# Patient Record
Sex: Male | Born: 1995 | Hispanic: Yes | Marital: Married | Smoking: Never smoker
Health system: Southern US, Community
[De-identification: ages and names within clinical notes are randomized; demographics above are authoritative.]

---

## 2020-11-10 ENCOUNTER — Emergency Department (HOSPITAL_COMMUNITY): Payer: Self-pay

## 2020-11-10 ENCOUNTER — Other Ambulatory Visit: Payer: Self-pay

## 2020-11-10 ENCOUNTER — Encounter (HOSPITAL_COMMUNITY): Payer: Self-pay

## 2020-11-10 ENCOUNTER — Emergency Department (HOSPITAL_COMMUNITY)
Admission: EM | Admit: 2020-11-10 | Discharge: 2020-11-10 | Disposition: A | Discharge status: Home / Self Care | Payer: Self-pay | Attending: Emergency Medicine | Admitting: Emergency Medicine

## 2020-11-10 DIAGNOSIS — K209 Esophagitis, unspecified without bleeding: Secondary | ICD-10-CM | POA: Insufficient documentation

## 2020-11-10 DIAGNOSIS — K2900 Acute gastritis without bleeding: Secondary | ICD-10-CM | POA: Insufficient documentation

## 2020-11-10 LAB — HEPATIC FUNCTION PANEL
ALT: 90 U/L — ABNORMAL HIGH (ref 0–44)
AST: 53 U/L — ABNORMAL HIGH (ref 15–41)
Albumin: 4.3 g/dL (ref 3.5–5.0)
Alkaline Phosphatase: 43 U/L (ref 38–126)
Bilirubin, Direct: 0.3 mg/dL — ABNORMAL HIGH (ref 0.0–0.2)
Indirect Bilirubin: 0.5 mg/dL (ref 0.3–0.9)
Total Bilirubin: 0.8 mg/dL (ref 0.3–1.2)
Total Protein: 8 g/dL (ref 6.5–8.1)

## 2020-11-10 LAB — BASIC METABOLIC PANEL
Anion gap: 8 (ref 5–15)
BUN: 14 mg/dL (ref 6–20)
CO2: 30 mmol/L (ref 22–32)
Calcium: 10 mg/dL (ref 8.9–10.3)
Chloride: 100 mmol/L (ref 98–111)
Creatinine, Ser: 0.93 mg/dL (ref 0.61–1.24)
GFR, Estimated: >60 mL/min (ref >60)
Glucose, Bld: 96 mg/dL (ref 70–99)
Potassium: 4.4 mmol/L (ref 3.5–5.1)
Sodium: 138 mmol/L (ref 135–145)

## 2020-11-10 LAB — CBC
HCT: 45.2 % (ref 39.0–52.0)
Hemoglobin: 15.2 g/dL (ref 13.0–17.0)
MCH: 29.9 pg (ref 26.0–34.0)
MCHC: 33.6 g/dL (ref 30.0–36.0)
MCV: 89 fL (ref 80.0–100.0)
Platelets: 272 10*3/uL (ref 150–400)
RBC: 5.08 MIL/uL (ref 4.22–5.81)
RDW: 13.2 % (ref 11.5–15.5)
WBC: 9 10*3/uL (ref 4.0–10.5)
nRBC: 0 % (ref 0.0–0.2)

## 2020-11-10 LAB — TROPONIN I (HIGH SENSITIVITY)
Troponin I (High Sensitivity): 3 ng/L (ref <18)
Troponin I (High Sensitivity): 4 ng/L (ref <18)

## 2020-11-10 LAB — LIPASE, BLOOD: Lipase: 34 U/L (ref 11–51)

## 2020-11-10 LAB — LACTIC ACID, PLASMA: Lactic Acid, Venous: 1.4 mmol/L (ref 0.5–1.9)

## 2020-11-10 MED ORDER — LIDOCAINE VISCOUS HCL 2 % MT SOLN
15.0000 mL | Freq: Once | OROMUCOSAL | Status: AC
  Administered 2020-11-10: 15 mL via ORAL
  Filled 2020-11-10: qty 15

## 2020-11-10 MED ORDER — ALUM & MAG HYDROXIDE-SIMETH 200-200-20 MG/5ML PO SUSP
30.0000 mL | Freq: Once | ORAL | Status: AC
  Administered 2020-11-10: 30 mL via ORAL
  Filled 2020-11-10: qty 30

## 2020-11-10 MED ORDER — MORPHINE SULFATE (PF) 4 MG/ML IV SOLN
4.0000 mg | Freq: Once | INTRAVENOUS | Status: AC
  Administered 2020-11-10: 4 mg via INTRAVENOUS
  Filled 2020-11-10: qty 1

## 2020-11-10 MED ORDER — PANTOPRAZOLE SODIUM 40 MG IV SOLR
40.0000 mg | Freq: Once | INTRAVENOUS | Status: AC
  Administered 2020-11-10: 40 mg via INTRAVENOUS
  Filled 2020-11-10: qty 40

## 2020-11-10 MED ORDER — LACTATED RINGERS IV BOLUS
1000.0000 mL | Freq: Once | INTRAVENOUS | Status: AC
  Administered 2020-11-10: 1000 mL via INTRAVENOUS

## 2020-11-10 MED ORDER — IOHEXOL 350 MG/ML SOLN
80.0000 mL | Freq: Once | INTRAVENOUS | Status: AC | PRN
  Administered 2020-11-10: 80 mL via INTRAVENOUS

## 2020-11-10 MED ORDER — ONDANSETRON HCL 4 MG/2ML IJ SOLN
4.0000 mg | Freq: Once | INTRAMUSCULAR | Status: AC
  Administered 2020-11-10: 4 mg via INTRAVENOUS
  Filled 2020-11-10: qty 2

## 2020-11-10 MED ORDER — LIDOCAINE VISCOUS HCL 2 % MT SOLN: 15.0000 mL | Freq: Four times a day (QID) | OROMUCOSAL | 0 refills | Status: AC | PRN

## 2020-11-10 NOTE — ED Notes (Signed)
Pt able to tolerate PO intake at this time °

## 2020-11-10 NOTE — ED Triage Notes (Signed)
Patient arrived POV. With complaints of chest and back pain, SOB. Started two days ago and is getting worse. It hurts really bad when the patient eats. Feels like the food gets stuck in the middle of his chest.

## 2020-11-10 NOTE — Discharge Instructions (Signed)
The labs today look okay.  The CAT scan was normal.  This could be a lot of acid in your esophagus and your stomach causing the symptoms.  It will be important for you to continue the acid pill but also you were given a prescription for the lidocaine that might help with the pain in the meantime.  Make sure you mix it with 15 mL of Maalox.  If you continue to have pain you will need to see a specialist.  Avoid any alcohol, spicy foods or ibuprofen.  If you start developing fever, persistent vomiting, passing out or inability to breathe you should return to the emergency room.

## 2020-11-10 NOTE — ED Notes (Signed)
Pt given sprite and crackers for PO challenge.  

## 2020-11-10 NOTE — ED Provider Notes (Signed)
[facility]-EMERGENCY DEPT Provider Note   CSN: [id number] Arrival date & time: 11/10/20  1859     History Chief Complaint  Patient presents with   Chest Pain   Shortness of Breath   Back Pain    Kevin Shaw is a 25 y.o. male.  Patient is a healthy 25 year old male presenting today with complaints of upper epigastric and lower chest pain.  Patient reports he drank approximately 24 beers on Saturday and was feeling his normal self but woke up on Sunday with epigastric abdominal pain and lower chest pain.  It was painful when he tried to eat or drink and he went to [facility] on Sunday.  He reports while he was there he had blood work done and a plain x-ray and was told it was acid in his stomach and was given an antacid pill.  He has been trying to take those pills since Sunday but reports his symptoms are worsening.  Now every time he tries to eat or swallow he gets a severe pain in his xiphoid area that shoots into his back causes him to sweat and have such severe pain that makes him catch his breath.  He will also sometimes regurgitate what he ate.  He is tolerating his saliva and liquids.  He has not had fever and denies any blood in his emesis.  Stools have been normal.  No prior history of similar symptoms.  He normally will just drink heavily on the weekends but not during the week.  He denies excessive NSAID use or prior history of stomach ulcers.  Currently the pain is an 8 out of 10 and he can feel it some in his back.  The history is provided by the patient.  Chest Pain Pain location:  Substernal area and epigastric Pain quality: aching, sharp, shooting and stabbing   Pain radiates to:  Mid back Pain severity:  Severe Onset quality:  Gradual Duration:  2 days Timing:  Constant Progression:  Worsening Chronicity:  New Context comment:  Patient initially started getting abdominal pain on Sunday after drinking 24 beers on Saturday Relieved by:   Nothing Exacerbated by: Eating. Ineffective treatments:  Antacids Associated symptoms: abdominal pain, anorexia, back pain, diaphoresis, nausea, shortness of breath and vomiting   Associated symptoms: no cough and no fever   Risk factors comment:  No known medical problems Shortness of Breath Associated symptoms: abdominal pain, chest pain, diaphoresis and vomiting   Associated symptoms: no cough and no fever   Back Pain Associated symptoms: abdominal pain and chest pain   Associated symptoms: no fever       No past medical history on file.  There are no problems to display for this patient.        No family history on file.     Home Medications Prior to Admission medications   Not on File    Allergies    Patient has no allergy information on record.  Review of Systems   Review of Systems  Constitutional:  Positive for diaphoresis. Negative for fever.  Respiratory:  Positive for shortness of breath. Negative for cough.   Cardiovascular:  Positive for chest pain.  Gastrointestinal:  Positive for abdominal pain, anorexia, nausea and vomiting.  Musculoskeletal:  Positive for back pain.  All other systems reviewed and are negative.  Physical Exam Updated Vital Signs BP (!) 144/89 (BP Location: Left Arm)   Pulse 75   Temp 98.2 F (36.8 C) (Oral)  Resp 18   Ht 5\' 7"  (1.702 m)   Wt 108.9 kg   SpO2 96%   BMI 37.59 kg/m   Physical Exam Vitals and nursing note reviewed.  Constitutional:      General: He is not in acute distress.    Appearance: He is well-developed.  HENT:     Head: Normocephalic and atraumatic.  Eyes:     Conjunctiva/sclera: Conjunctivae normal.     Pupils: Pupils are equal, round, and reactive to light.  Cardiovascular:     Rate and Rhythm: Normal rate and regular rhythm.     Pulses: Normal pulses.     Heart sounds: No murmur heard. Pulmonary:     Effort: Pulmonary effort is normal. No respiratory distress.     Breath sounds: Normal  breath sounds. No wheezing or rales.  Abdominal:     General: There is no distension.     Palpations: Abdomen is soft.     Tenderness: There is abdominal tenderness. There is guarding. There is no right CVA tenderness, left CVA tenderness or rebound.     Comments: Upper abdominal tenderness most localized in the epigastric and xiphoid region.  Mild right upper and left upper quadrant tenderness.  Musculoskeletal:        General: No tenderness. Normal range of motion.     Cervical back: Normal range of motion and neck supple.     Right lower leg: No edema.     Left lower leg: No edema.  Skin:    General: Skin is warm and dry.     Findings: No erythema or rash.  Neurological:     Mental Status: He is alert and oriented to person, place, and time. Mental status is at baseline.  Psychiatric:        Mood and Affect: Mood normal.        Behavior: Behavior normal.    ED Results / Procedures / Treatments   Labs (all labs ordered are listed, but only abnormal results are displayed) Labs Reviewed  HEPATIC FUNCTION PANEL - Abnormal; Notable for the following components:      Result Value   AST 53 (*)    ALT 90 (*)    Bilirubin, Direct 0.3 (*)    All other components within normal limits  BASIC METABOLIC PANEL  CBC  LIPASE, BLOOD  LACTIC ACID, PLASMA  TROPONIN I (HIGH SENSITIVITY)  TROPONIN I (HIGH SENSITIVITY)    EKG EKG Interpretation  Date/Time:  Thursday November 10 2020 19:34:10 EDT Ventricular Rate:  84 PR Interval:  138 QRS Duration: 92 QT Interval:  362 QTC Calculation: 427 R Axis:   6 Text Interpretation: Normal sinus rhythm Normal ECG No previous tracing Confirmed by [id number] (Gwyneth Sprout) on 11/10/2020 8:30:02 PM  Radiology CT ABDOMEN PELVIS W CONTRAST  Result Date: 11/10/2020 CLINICAL DATA:  Shortness of breath and abdominal pain. EXAM: CT ABDOMEN AND PELVIS WITH CONTRAST TECHNIQUE: Multidetector CT imaging of the abdomen and pelvis was performed using the standard  protocol following bolus administration of intravenous contrast. CONTRAST:  94mL OMNIPAQUE IOHEXOL 350 MG/ML SOLN COMPARISON:  None. FINDINGS: Lower chest: No acute abnormality. Hepatobiliary: There is diffuse fatty infiltration of the liver parenchyma. No focal liver abnormality is seen. No gallstones, gallbladder wall thickening, or biliary dilatation. Pancreas: Unremarkable. No pancreatic ductal dilatation or surrounding inflammatory changes. Spleen: Normal in size without focal abnormality. Adrenals/Urinary Tract: Adrenal glands are unremarkable. Kidneys are normal, without renal calculi, focal lesion, or hydronephrosis. Bladder is unremarkable. Stomach/Bowel:  Stomach is within normal limits. Appendix appears normal. No evidence of bowel wall thickening, distention, or inflammatory changes. Vascular/Lymphatic: No significant vascular findings are present. No enlarged abdominal or pelvic lymph nodes. Reproductive: Prostate is unremarkable. Other: No abdominal wall hernia or abnormality. No abdominopelvic ascites. Musculoskeletal: No acute or significant osseous findings. IMPRESSION: 1. Fatty liver. 2. No acute or active process within the abdomen or pelvis. Electronically Signed   By: Aram Candela M.D.   On: 11/10/2020 22:45    Procedures Procedures   Medications Ordered in ED Medications - No data to display  ED Course  I have reviewed the triage vital signs and the nursing notes.  Pertinent labs & imaging results that were available during my care of the patient were reviewed by me and considered in my medical decision making (see chart for details).    MDM Rules/Calculators/A&P                           Patient is a 25 year old male presenting with worsening epigastric/xiphoid pain difficulty swallowing which anytime he tries to eat it makes the pain severely worse and he even vomits at times.  This is all in the setting of drinking 24 cans of beer on Saturday and symptoms starting the  next day.  Patient was seen in [facility] and had plain films and labs done which he reports were normal but he was given an antacid which she has been trying to take but is not helping his symptoms.  Concern for possible pancreatitis, gastric ulcer, esophageal spasm.  Lower suspicion for stricture at this time or foreign body.  Patient has not had any blood in his emesis but cannot rule out esophageal perforation.  Patient reports anytime he tries to eat the pain is so severe it causes him to sweat and feel short of breath but he denies any cough, fever or congestion.  Shortness of breath is only present when the pain is severe.  Upper abdominal pain on exam but no lower abdominal pain to suggest appendicitis or diverticulitis.  Patient denies any urinary symptoms. Labs and imaging pending.  Patient given pain control and IV fluids.  11:03 PM Since labs are reassuring.  Only mild elevation of LFTs but normal lipase.  Troponin is negative.  CT shows fatty liver but no other acute process.  Patient is feeling better after GI cocktail and IV meds.  Suspect most likely gastritis and esophagitis.  Will p.o. challenge the patient.  11:25 PM Pt tolerated po's without difficulty.  Will d/c home.   Final Clinical Impression(s) / ED Diagnoses Final diagnoses:  Esophagitis  Acute gastritis without hemorrhage, unspecified gastritis type    Rx / DC Orders ED Discharge Orders          Ordered    lidocaine (XYLOCAINE) 2 % solution  Every 6 hours PRN        11/10/20 2324             Gwyneth Sprout, MD 11/10/20 2325

## 2020-12-25 ENCOUNTER — Other Ambulatory Visit: Payer: Self-pay

## 2020-12-25 ENCOUNTER — Encounter (HOSPITAL_COMMUNITY): Payer: Self-pay

## 2020-12-25 ENCOUNTER — Emergency Department (HOSPITAL_COMMUNITY)
Admission: EM | Admit: 2020-12-25 | Discharge: 2020-12-25 | Disposition: A | Discharge status: Home / Self Care | Payer: Self-pay | Attending: Student | Admitting: Student

## 2020-12-25 DIAGNOSIS — M109 Gout, unspecified: Secondary | ICD-10-CM

## 2020-12-25 DIAGNOSIS — M10072 Idiopathic gout, left ankle and foot: Secondary | ICD-10-CM | POA: Insufficient documentation

## 2020-12-25 MED ORDER — PREDNISONE 20 MG PO TABS
40.0000 mg | ORAL_TABLET | Freq: Once | ORAL | Status: AC
  Administered 2020-12-25: 40 mg via ORAL
  Filled 2020-12-25: qty 2

## 2020-12-25 MED ORDER — PREDNISONE 20 MG PO TABS: 40.0000 mg | ORAL_TABLET | Freq: Every day | ORAL | 0 refills | Status: AC

## 2020-12-25 NOTE — ED Provider Notes (Signed)
[facility]-EMERGENCY DEPT Provider Note   CSN: [id number] Arrival date & time: 12/25/20  6720     History Chief Complaint  Patient presents with   Foot Pain    Kevin Shaw is a 25 y.o. male with a past medical history significant for gout who presents to the ED due to left ankle pain and edema that started last night. He notes he typically gets gout in his ankles. No fever or chills.  No injury.  Patient states he drank alcohol 2 nights ago.  He has not been taking his prophylactic gout medication. No treatment prior to arrival. No associated numbness/tingling. Pain is worse with palpation.   History obtained from patient and past medical records. No interpreter used during encounter.       History reviewed. No pertinent past medical history.  There are no problems to display for this patient.   History reviewed. No pertinent surgical history.     No family history on file.  Social History   Tobacco Use   Smoking status: Never   Smokeless tobacco: Never    Home Medications Prior to Admission medications   Medication Sig Start Date End Date Taking? Authorizing Provider  predniSONE (DELTASONE) 20 MG tablet Take 2 tablets (40 mg total) by mouth daily for 4 days. 12/25/20 12/29/20 Yes Maks Cavallero, Merla Riches, PA-C  albuterol (VENTOLIN HFA) 108 (90 Base) MCG/ACT inhaler Inhale 2 puffs into the lungs every 6 (six) hours as needed for wheezing or shortness of breath.    [provider]  lidocaine (XYLOCAINE) 2 % solution Use as directed 15 mLs in the mouth or throat every 6 (six) hours as needed (throat/stomach pain). Take mixed maalox 65mL 11/10/20   Gwyneth Sprout, MD  pantoprazole (PROTONIX) 20 MG tablet Take 20 mg by mouth daily.    [provider]    Allergies    Patient has no known allergies.  Review of Systems   Review of Systems  Constitutional:  Negative for chills and fever.  Musculoskeletal:  Positive for arthralgias and  joint swelling.  Neurological:  Negative for numbness.  All other systems reviewed and are negative.  Physical Exam Updated Vital Signs BP (!) 164/95 (BP Location: Right Arm)   Pulse 91   Temp 99.1 F (37.3 C)   Resp 18   Ht 5\' 7"  (1.702 m)   Wt 108.9 kg   SpO2 93%   BMI 37.59 kg/m   Physical Exam Vitals and nursing note reviewed.  Constitutional:      General: He is not in acute distress.    Appearance: He is not ill-appearing.  HENT:     Head: Normocephalic.  Eyes:     Pupils: Pupils are equal, round, and reactive to light.  Cardiovascular:     Rate and Rhythm: Normal rate and regular rhythm.     Pulses: Normal pulses.     Heart sounds: Normal heart sounds. No murmur heard.   No friction rub. No gallop.  Pulmonary:     Effort: Pulmonary effort is normal.     Breath sounds: Normal breath sounds.  Abdominal:     General: Abdomen is flat. There is no distension.     Palpations: Abdomen is soft.     Tenderness: There is no abdominal tenderness. There is no guarding or rebound.  Musculoskeletal:        General: Normal range of motion.     Cervical back: Neck supple.     Comments: Tenderness palpation  over lateral malleolus with mild edema.  No erythema.  Pedal pulses palpable.  Slight decreased range of motion due to pain.  Skin:    General: Skin is warm and dry.  Neurological:     General: No focal deficit present.     Mental Status: He is alert.  Psychiatric:        Mood and Affect: Mood normal.        Behavior: Behavior normal.    ED Results / Procedures / Treatments   Labs (all labs ordered are listed, but only abnormal results are displayed) Labs Reviewed - No data to display  EKG None  Radiology No results found.  Procedures Procedures   Medications Ordered in ED Medications  predniSONE (DELTASONE) tablet 40 mg (has no administration in time range)    ED Course  I have reviewed the triage vital signs and the nursing notes.  Pertinent labs &  imaging results that were available during my care of the patient were reviewed by me and considered in my medical decision making (see chart for details).    MDM Rules/Calculators/A&P                           25 year old male presents to the ED due to suspected gout flare.  History of gout typically in his ankles.  No fever or chills.  No injury.  Upon arrival, patient afebrile.  Triage noted patient to be tachycardic at 108 however, during my initial evaluation, patient's heart rate in the 90s.  Patient nontoxic-appearing.  Physical exam significant for tenderness over left malleolus with mild edema.  No warmth or erythema.  Left lower extremity neurovascularly intact with soft compartments.  Given patient's history, suspect gout flare.  Low suspicion for septic joint.  No injury to suggest fracture.  Patient discharged with prednisone. Advised patient to return to the ED if symptoms do not improve over the next 48 hours, if he develops a fever, or worsening of symptoms. Strict ED precautions discussed with patient. Patient states understanding and agrees to plan. Patient discharged home in no acute distress and stable vitals  Final Clinical Impression(s) / ED Diagnoses Final diagnoses:  Acute gout of left ankle, unspecified cause    Rx / DC Orders ED Discharge Orders          Ordered    predniSONE (DELTASONE) 20 MG tablet  Daily        12/25/20 1947             Jesusita Oka 12/25/20 1948    Kommor, Wyn Forster, MD 12/25/20 [phone]

## 2020-12-25 NOTE — Discharge Instructions (Signed)
It was a pleasure taking care of you today. As discussed, I suspect your symptoms are related to a gout flare. I am sending you home with steroids. Take daily for the next 4 days. I have included information on gout and how to prevent gout. I have also included the number of [facility]. Call to schedule an appointment to establish care. Return to the ER if you develop a fever or worsening of symptoms.

## 2020-12-25 NOTE — ED Triage Notes (Signed)
Pt reports left foot pain that began yesterday. Hx gout and noncompliant with medications. Pt says he doesn't know what he takes.

## 2020-12-25 NOTE — ED Provider Notes (Deleted)
Emergency Medicine Provider Triage Evaluation Note  Joselito Fieldhouse , a 25 y.o. male  was evaluated in triage.  Pt complains of left ankle pain and edema that started last night.  Patient has a history of gout and says it feels similar.  Typically gets gout in his ankles. No fever or chills.  No injury.  Patient states he drank alcohol 2 nights ago.  He has not been taking his prophylactic gout medication.  Review of Systems  Positive: arthralgia Negative: fever  Physical Exam  BP 139/89   Pulse (!) 108   Temp 99.1 F (37.3 C)   Resp 18   Ht 5\' 7"  (1.702 m)   Wt 108.9 kg   SpO2 97%   BMI 37.59 kg/m  Gen:   Awake, no distress   Resp:  Normal effort  MSK:   Moves extremities without difficulty  Other:  Tenderness palpation over left malleolus with mild edema.  Pedal pulses palpable.  Medical Decision Making  Medically screening exam initiated at 7:19 PM.  Appropriate orders placed.  Bengie Kaucher was informed that the remainder of the evaluation will be completed by another provider, this initial triage assessment does not replace that evaluation, and the importance of remaining in the ED until their evaluation is complete.  Suspect gout flare. Low suspicion for septic joint. No injury to suggest fracture.    Denman George, Mannie Stabile 12/25/20 1921

## 2021-02-15 ENCOUNTER — Ambulatory Visit: Payer: Self-pay | Admitting: Family Medicine

## 2021-03-11 ENCOUNTER — Emergency Department (HOSPITAL_COMMUNITY)
Admission: EM | Admit: 2021-03-11 | Discharge: 2021-03-12 | Disposition: A | Discharge status: Home / Self Care | Payer: Self-pay | Attending: Emergency Medicine | Admitting: Emergency Medicine

## 2021-03-11 ENCOUNTER — Other Ambulatory Visit: Payer: Self-pay

## 2021-03-11 ENCOUNTER — Encounter (HOSPITAL_COMMUNITY): Payer: Self-pay

## 2021-03-11 DIAGNOSIS — M25461 Effusion, right knee: Secondary | ICD-10-CM | POA: Insufficient documentation

## 2021-03-11 DIAGNOSIS — L600 Ingrowing nail: Secondary | ICD-10-CM

## 2021-03-11 DIAGNOSIS — M109 Gout, unspecified: Secondary | ICD-10-CM

## 2021-03-11 DIAGNOSIS — M79675 Pain in left toe(s): Secondary | ICD-10-CM | POA: Insufficient documentation

## 2021-03-11 DIAGNOSIS — M79674 Pain in right toe(s): Secondary | ICD-10-CM | POA: Insufficient documentation

## 2021-03-11 LAB — COMPREHENSIVE METABOLIC PANEL
ALT: 33 U/L (ref 0–44)
AST: 24 U/L (ref 15–41)
Albumin: 4.3 g/dL (ref 3.5–5.0)
Alkaline Phosphatase: 55 U/L (ref 38–126)
Anion gap: 9 (ref 5–15)
BUN: 15 mg/dL (ref 6–20)
CO2: 26 mmol/L (ref 22–32)
Calcium: 9.1 mg/dL (ref 8.9–10.3)
Chloride: 102 mmol/L (ref 98–111)
Creatinine, Ser: 1.12 mg/dL (ref 0.61–1.24)
GFR, Estimated: >60 mL/min (ref >60)
Glucose, Bld: 87 mg/dL (ref 70–99)
Potassium: 3.8 mmol/L (ref 3.5–5.1)
Sodium: 137 mmol/L (ref 135–145)
Total Bilirubin: 0.7 mg/dL (ref 0.3–1.2)
Total Protein: 8.2 g/dL — ABNORMAL HIGH (ref 6.5–8.1)

## 2021-03-11 LAB — CBC WITH DIFFERENTIAL/PLATELET
Abs Immature Granulocytes: 0.06 10*3/uL (ref 0.00–0.07)
Basophils Absolute: 0 10*3/uL (ref 0.0–0.1)
Basophils Relative: 0 %
Eosinophils Absolute: 0.2 10*3/uL (ref 0.0–0.5)
Eosinophils Relative: 2 %
HCT: 44.3 % (ref 39.0–52.0)
Hemoglobin: 14.7 g/dL (ref 13.0–17.0)
Immature Granulocytes: 1 %
Lymphocytes Relative: 20 %
Lymphs Abs: 2.1 10*3/uL (ref 0.7–4.0)
MCH: 29.1 pg (ref 26.0–34.0)
MCHC: 33.2 g/dL (ref 30.0–36.0)
MCV: 87.7 fL (ref 80.0–100.0)
Monocytes Absolute: 1 10*3/uL (ref 0.1–1.0)
Monocytes Relative: 10 %
Neutro Abs: 6.8 10*3/uL (ref 1.7–7.7)
Neutrophils Relative %: 67 %
Platelets: 292 10*3/uL (ref 150–400)
RBC: 5.05 MIL/uL (ref 4.22–5.81)
RDW: 12.8 % (ref 11.5–15.5)
WBC: 10.2 10*3/uL (ref 4.0–10.5)
nRBC: 0 % (ref 0.0–0.2)

## 2021-03-11 MED ORDER — CEPHALEXIN 500 MG PO CAPS: 500.0000 mg | ORAL_CAPSULE | Freq: Four times a day (QID) | ORAL | 0 refills | Status: AC

## 2021-03-11 MED ORDER — HYDROCODONE-ACETAMINOPHEN 5-325 MG PO TABS
2.0000 | ORAL_TABLET | Freq: Once | ORAL | Status: AC
Start: 2021-03-11 — End: 2021-03-11
  Administered 2021-03-11: 2 via ORAL
  Filled 2021-03-11: qty 2

## 2021-03-11 MED ORDER — HYDROCODONE-ACETAMINOPHEN 5-325 MG PO TABS: 1.0000 | ORAL_TABLET | Freq: Four times a day (QID) | ORAL | 0 refills | Status: DC | PRN

## 2021-03-11 MED ORDER — PREDNISONE 10 MG PO TABS: 20.0000 mg | ORAL_TABLET | Freq: Two times a day (BID) | ORAL | 0 refills | Status: DC

## 2021-03-11 MED ORDER — CEPHALEXIN 500 MG PO CAPS
500.0000 mg | ORAL_CAPSULE | Freq: Once | ORAL | Status: AC
  Administered 2021-03-11: 500 mg via ORAL
  Filled 2021-03-11: qty 1

## 2021-03-11 MED ORDER — PREDNISONE 20 MG PO TABS
40.0000 mg | ORAL_TABLET | Freq: Once | ORAL | Status: AC
  Administered 2021-03-11: 40 mg via ORAL
  Filled 2021-03-11: qty 2

## 2021-03-11 NOTE — Discharge Instructions (Signed)
Begin taking prednisone and Keflex as prescribed.  Take hydrocodone as prescribed as needed for pain.  Apply warm compresses as frequently as possible for the next several days.  If not improving, follow-up with podiatry.  The contact information for the podiatry office has been provided in this discharge summary for you to call and make these arrangements.

## 2021-03-11 NOTE — ED Triage Notes (Signed)
Pt reports with right knee swelling (from his gout) and right big toe swelling. Pt states that he took his medication for gout today but it is not helping.

## 2021-03-11 NOTE — ED Provider Notes (Signed)
[facility]-EMERGENCY DEPT Provider Note   CSN: [id number] Arrival date & time: 03/11/21  1916     History Chief Complaint  Patient presents with   Joint Swelling   Toe Pain    Jazier Mcglamery is a 25 y.o. male.  Patient is a 25 year old male with history of gout.  He presents today with complaints of right knee pain and bilateral toe pain.  Patient has history of gout and this feels similar.  He denies any specific injury or trauma.  He denies any fevers or chills.  Both great toes have been swollen and painful at the nail  The history is provided by the patient.      History reviewed. No pertinent past medical history.  There are no problems to display for this patient.   History reviewed. No pertinent surgical history.     History reviewed. No pertinent family history.  Social History   Tobacco Use   Smoking status: Never   Smokeless tobacco: Never    Home Medications Prior to Admission medications   Medication Sig Start Date End Date Taking? Authorizing Provider  albuterol (VENTOLIN HFA) 108 (90 Base) MCG/ACT inhaler Inhale 2 puffs into the lungs every 6 (six) hours as needed for wheezing or shortness of breath.    [provider]  lidocaine (XYLOCAINE) 2 % solution Use as directed 15 mLs in the mouth or throat every 6 (six) hours as needed (throat/stomach pain). Take mixed maalox 18mL 11/10/20   Gwyneth Sprout, MD  pantoprazole (PROTONIX) 20 MG tablet Take 20 mg by mouth daily.    [provider]    Allergies    Patient has no known allergies.  Review of Systems   Review of Systems  All other systems reviewed and are negative.  Physical Exam Updated Vital Signs BP (!) 167/93 (BP Location: Left Arm)   Pulse 94   Temp 97.8 F (36.6 C) (Oral)   Resp 16   SpO2 96%   Physical Exam Vitals and nursing note reviewed.  Constitutional:      General: He is not in acute distress.    Appearance: Normal appearance. He is  not ill-appearing.  HENT:     Head: Normocephalic and atraumatic.  Pulmonary:     Effort: Pulmonary effort is normal.  Musculoskeletal:     Comments: The right knee has a moderate sized effusion.  He has pain with range of motion.  There is no erythema or warmth.  He does have pain with range of motion.  Skin:    General: Skin is warm and dry.  Neurological:     Mental Status: He is alert and oriented to person, place, and time.    ED Results / Procedures / Treatments   Labs (all labs ordered are listed, but only abnormal results are displayed) Labs Reviewed  COMPREHENSIVE METABOLIC PANEL - Abnormal; Notable for the following components:      Result Value   Total Protein 8.2 (*)    All other components within normal limits  CBC WITH DIFFERENTIAL/PLATELET    EKG None  Radiology No results found.  Procedures Procedures   Medications Ordered in ED Medications  predniSONE (DELTASONE) tablet 40 mg (has no administration in time range)  HYDROcodone-acetaminophen (NORCO/VICODIN) 5-325 MG per tablet 2 tablet (has no administration in time range)  cephALEXin (KEFLEX) capsule 500 mg (has no administration in time range)    ED Course  I have reviewed the triage vital signs and the nursing  notes.  Pertinent labs & imaging results that were available during my care of the patient were reviewed by me and considered in my medical decision making (see chart for details).    MDM Rules/Calculators/A&P  Patient presenting with what appears to be a recurrent flareup of gout in his right knee.  This will be treated with prednisone and hydrocodone.  He also has what appears to be the beginnings of an ingrown toenail.  Patient to be treated with Keflex, warm compresses, and follow-up as needed if not improving.  Final Clinical Impression(s) / ED Diagnoses Final diagnoses:  None    Rx / DC Orders ED Discharge Orders     None        Geoffery Lyons, MD 03/11/21 2327

## 2021-03-11 NOTE — ED Provider Notes (Addendum)
Emergency Medicine Provider Triage Evaluation Note  Kevin Shaw , a 25 y.o. male  was evaluated in triage.  Pt complains of bilateral ingrown toenails in the big toes, and right knee pain secondary to known gout. Taking indomethacin with some improvement. No fevers or chills.   Review of Systems  Positive: Bilateral great toe pain, swelling, redness, purulent drainage. Right knee pain.  Negative: Fevers, chills  Physical Exam  BP (!) 167/93 (BP Location: Left Arm)   Pulse 94   Temp 97.8 F (36.6 C) (Oral)   Resp 16   SpO2 96%  Gen:   Awake, no distress   Resp:  Normal effort  MSK:   Moves extremities without difficulty  Other:  In grown toenails with erythema and induration, bilateral great toes. Right great toe with fluctuance. TTP over the lateral joint line of the right knee without erythema, warmth to the touch  Medical Decision Making  Medically screening exam initiated at 8:29 PM.  Appropriate orders placed.  [name] was informed that the remainder of the evaluation will be completed by another provider, this initial triage assessment does not replace that evaluation, and the importance of remaining in the ED until their evaluation is complete.  Labs ordered and collected by RN prior to my evaluation.  This chart was dictated using voice recognition software, Dragon. Despite the best efforts of this provider to proofread and correct errors, errors may still occur which can change documentation meaning.    Paris Lore, PA-C 03/11/21 2031    Cannan Beeck, Eugene Gavia, PA-C 03/11/21 2032    Mancel Bale, MD 03/12/21 1340

## 2021-03-16 ENCOUNTER — Ambulatory Visit (INDEPENDENT_AMBULATORY_CARE_PROVIDER_SITE_OTHER): Payer: Self-pay | Admitting: Podiatry

## 2021-03-16 DIAGNOSIS — Z91199 Patient's noncompliance with other medical treatment and regimen due to unspecified reason: Secondary | ICD-10-CM

## 2021-03-16 NOTE — Progress Notes (Signed)
No show for appt. 

## 2021-04-30 ENCOUNTER — Other Ambulatory Visit: Payer: Self-pay

## 2021-04-30 ENCOUNTER — Emergency Department (HOSPITAL_COMMUNITY): Payer: Self-pay

## 2021-04-30 ENCOUNTER — Emergency Department (HOSPITAL_COMMUNITY)
Admission: EM | Admit: 2021-04-30 | Discharge: 2021-05-01 | Discharge status: Against Medical Advice | Payer: Self-pay | Attending: Emergency Medicine | Admitting: Emergency Medicine

## 2021-04-30 ENCOUNTER — Encounter (HOSPITAL_COMMUNITY): Payer: Self-pay | Admitting: Emergency Medicine

## 2021-04-30 DIAGNOSIS — Z5321 Procedure and treatment not carried out due to patient leaving prior to being seen by health care provider: Secondary | ICD-10-CM | POA: Insufficient documentation

## 2021-04-30 DIAGNOSIS — R109 Unspecified abdominal pain: Secondary | ICD-10-CM | POA: Insufficient documentation

## 2021-04-30 LAB — URINALYSIS, ROUTINE W REFLEX MICROSCOPIC
Bilirubin Urine: NEGATIVE
Glucose, UA: NEGATIVE mg/dL
Hgb urine dipstick: NEGATIVE
Ketones, ur: NEGATIVE mg/dL
Leukocytes,Ua: NEGATIVE
Nitrite: NEGATIVE
Protein, ur: NEGATIVE mg/dL
Specific Gravity, Urine: 1.015 (ref 1.005–1.030)
pH: 6.5 (ref 5.0–8.0)

## 2021-04-30 LAB — CBC
HCT: 44 % (ref 39.0–52.0)
Hemoglobin: 14.6 g/dL (ref 13.0–17.0)
MCH: 29 pg (ref 26.0–34.0)
MCHC: 33.2 g/dL (ref 30.0–36.0)
MCV: 87.5 fL (ref 80.0–100.0)
Platelets: 338 10*3/uL (ref 150–400)
RBC: 5.03 MIL/uL (ref 4.22–5.81)
RDW: 12.9 % (ref 11.5–15.5)
WBC: 8.2 10*3/uL (ref 4.0–10.5)
nRBC: 0 % (ref 0.0–0.2)

## 2021-04-30 LAB — COMPREHENSIVE METABOLIC PANEL
ALT: 45 U/L — ABNORMAL HIGH (ref 0–44)
AST: 28 U/L (ref 15–41)
Albumin: 3.7 g/dL (ref 3.5–5.0)
Alkaline Phosphatase: 56 U/L (ref 38–126)
Anion gap: 10 (ref 5–15)
BUN: 13 mg/dL (ref 6–20)
CO2: 23 mmol/L (ref 22–32)
Calcium: 8.9 mg/dL (ref 8.9–10.3)
Chloride: 103 mmol/L (ref 98–111)
Creatinine, Ser: 1.22 mg/dL (ref 0.61–1.24)
GFR, Estimated: >60 mL/min (ref >60)
Glucose, Bld: 111 mg/dL — ABNORMAL HIGH (ref 70–99)
Potassium: 3.8 mmol/L (ref 3.5–5.1)
Sodium: 136 mmol/L (ref 135–145)
Total Bilirubin: 0.4 mg/dL (ref 0.3–1.2)
Total Protein: 8.1 g/dL (ref 6.5–8.1)

## 2021-04-30 LAB — LIPASE, BLOOD: Lipase: 32 U/L (ref 11–51)

## 2021-04-30 MED ORDER — IOHEXOL 300 MG/ML  SOLN
100.0000 mL | Freq: Once | INTRAMUSCULAR | Status: AC | PRN
  Administered 2021-04-30: 100 mL via INTRAVENOUS

## 2021-04-30 NOTE — ED Notes (Signed)
Pt leaving ED and stated that he did not want to keep waiting any longer, IV removed by this Clinical research associate.

## 2021-04-30 NOTE — ED Provider Triage Note (Signed)
Emergency Medicine Provider Triage Evaluation Note  Kevin Shaw , a 26 y.o. male  was evaluated in triage.  Pt complains of left lateral abdominal pain starting yesterday.  Pain is sharp and does not radiate.  It is worse with eating.  Pain is getting worse.  No right-sided pain.  No fevers or vomiting.  No diarrhea or constipation.  No urine symptoms.  Review of Systems  Positive: Abdominal pain Negative: Fever  Physical Exam  BP 133/68 (BP Location: Left Arm)    Pulse 79    Temp 98.5 F (36.9 C) (Oral)    Resp 16    SpO2 100%  Gen:   Awake, no distress   Resp:  Normal effort  MSK:   Moves extremities without difficulty  Other:  Patient with tenderness to palpation the left lateral abdomen, lateral to the umbilicus.  Patient winces in pain and guards this area.  Medical Decision Making  Medically screening exam initiated at 4:55 PM.  Appropriate orders placed.  Shiv Lauer was informed that the remainder of the evaluation will be completed by another provider, this initial triage assessment does not replace that evaluation, and the importance of remaining in the ED until their evaluation is complete.     Carlisle Cater, PA-C 04/30/21 1656

## 2021-04-30 NOTE — ED Notes (Signed)
Patient transported to CT 

## 2021-04-30 NOTE — ED Triage Notes (Signed)
L sided abd pain since last night.  Denies nausea, vomiting, and diarrhea.  Pain worse after eating.

## 2021-10-01 ENCOUNTER — Encounter (HOSPITAL_COMMUNITY): Payer: Self-pay | Admitting: Emergency Medicine

## 2021-10-01 ENCOUNTER — Emergency Department (HOSPITAL_COMMUNITY)
Admission: EM | Admit: 2021-10-01 | Discharge: 2021-10-01 | Disposition: A | Discharge status: Home / Self Care | Payer: Self-pay | Attending: Emergency Medicine | Admitting: Emergency Medicine

## 2021-10-01 DIAGNOSIS — M109 Gout, unspecified: Secondary | ICD-10-CM | POA: Insufficient documentation

## 2021-10-01 MED ORDER — HYDROCODONE-ACETAMINOPHEN 5-325 MG PO TABS
1.0000 | ORAL_TABLET | Freq: Once | ORAL | Status: AC
  Administered 2021-10-01: 1 via ORAL
  Filled 2021-10-01: qty 1

## 2021-10-01 MED ORDER — PREDNISONE 10 MG (21) PO TBPK: ORAL_TABLET | Freq: Every day | ORAL | 0 refills | Status: AC

## 2021-10-01 MED ORDER — OXYCODONE HCL 5 MG PO TABS: 5.0000 mg | ORAL_TABLET | ORAL | 0 refills | Status: AC | PRN

## 2021-10-01 NOTE — ED Provider Notes (Signed)
[facility]-EMERGENCY DEPT Provider Note   CSN: [id number] Arrival date & time: 10/01/21  1913     History  Chief Complaint  Patient presents with   Knee Pain    Kevin Shaw is a 26 y.o. male.  26 year old male presents today for evaluation of right knee pain.  He has history of gout.  Patient is noncompliant with his allopurinol.  States he drank 1 beer yesterday.  He is aware that alcohol is a trigger to his gout but still drink 1 beer.  Has not taken anything prior to arrival.  Denies fever.  He has had gout in this joint in the past.  The history is provided by the patient. No language interpreter was used.       Home Medications Prior to Admission medications   Medication Sig Start Date End Date Taking? Authorizing Provider  albuterol (VENTOLIN HFA) 108 (90 Base) MCG/ACT inhaler Inhale 2 puffs into the lungs every 6 (six) hours as needed for wheezing or shortness of breath.    [provider]  cephALEXin (KEFLEX) 500 MG capsule Take 1 capsule (500 mg total) by mouth 4 (four) times daily. 03/11/21   Geoffery Lyons, MD  HYDROcodone-acetaminophen (NORCO) 5-325 MG tablet Take 1-2 tablets by mouth every 6 (six) hours as needed. 03/11/21   Geoffery Lyons, MD  lidocaine (XYLOCAINE) 2 % solution Use as directed 15 mLs in the mouth or throat every 6 (six) hours as needed (throat/stomach pain). Take mixed maalox 38mL 11/10/20   Gwyneth Sprout, MD  pantoprazole (PROTONIX) 20 MG tablet Take 20 mg by mouth daily.    [provider]  predniSONE (DELTASONE) 10 MG tablet Take 2 tablets (20 mg total) by mouth 2 (two) times daily with a meal. 03/11/21   Geoffery Lyons, MD      Allergies    Patient has no known allergies.    Review of Systems   Review of Systems  Constitutional:  Negative for fever.  Musculoskeletal:  Positive for arthralgias and joint swelling.  All other systems reviewed and are negative.   Physical Exam Updated Vital Signs BP  122/79   Pulse (!) 103   Temp 97.8 F (36.6 C)   Resp 20   SpO2 95%  Physical Exam Vitals and nursing note reviewed.  Constitutional:      General: He is not in acute distress.    Appearance: Normal appearance. He is not ill-appearing.  HENT:     Head: Normocephalic and atraumatic.     Nose: Nose normal.  Eyes:     Conjunctiva/sclera: Conjunctivae normal.  Pulmonary:     Effort: Pulmonary effort is normal. No respiratory distress.  Musculoskeletal:        General: No deformity.     Comments: Swelling present to right knee.  Knee with visible swelling.  Without erythema.  Significant tenderness to palpation present.  He is able to stand and bear weight.  Full range of motion in the right knee.  Ankle with full range of motion without tenderness to palpation.  Hip with full range of motion without tenderness to palpation.  Skin:    Findings: No rash.  Neurological:     Mental Status: He is alert.     ED Results / Procedures / Treatments   Labs (all labs ordered are listed, but only abnormal results are displayed) Labs Reviewed - No data to display  EKG None  Radiology No results found.  Procedures Procedures    Medications  Ordered in ED Medications  HYDROcodone-acetaminophen (NORCO/VICODIN) 5-325 MG per tablet 1 tablet (has no administration in time range)    ED Course/ Medical Decision Making/ A&P                           Medical Decision Making  26 year old male with history of gout presents today for evaluation of right knee pain and swelling after drinking 1 beer yesterday.  He is not compliant with his allopurinol.  Has had gout attacks in this joint in the past.  Low suspicion for septic arthritis given he has history of gout which has affected this particular joint.  We discussed arthrocentesis however he defers and would like to proceed with management of gout.  Dose of pain medication given in the department.  Discharged with prednisone Dosepak, and short  supply of pain medication for symptom management.  Return precautions discussed.  Patient voices understanding and is in agreement with plan.  Discussed follow-up with PCP.  Discussed avoiding alcohol and other triggers.   Final Clinical Impression(s) / ED Diagnoses Final diagnoses:  Acute gout of right knee, unspecified cause    Rx / DC Orders ED Discharge Orders          Ordered    predniSONE (STERAPRED UNI-PAK 21 TAB) 10 MG (21) TBPK tablet  Daily        10/01/21 2143    oxyCODONE (ROXICODONE) 5 MG immediate release tablet  Every 4 hours PRN        10/01/21 2143              Marita Kansas, PA-C 10/01/21 2145    Bethann Berkshire, MD 10/02/21 1103

## 2021-10-01 NOTE — ED Triage Notes (Signed)
Patient c/o R knee pain since last night. Hx gout and states he thinks it is the same. States out of colchicine since last week.

## 2021-10-01 NOTE — ED Notes (Signed)
Pt d/c home with wife per MD order, Discharge summary reviewed, pt verbalizes understanding. Off unit via WC- no s/s of acute distress noted at discharge, reports wife is discharge ride home.

## 2021-10-01 NOTE — Discharge Instructions (Addendum)
Your exam today was overall reassuring.  Given your history and exam you likely have an acute episode of gout.  You received a dose of pain medication in the emergency room.  I have sent additional supply into the pharmacy for you along with prednisone Dosepak.  Follow-up with your primary care provider.  Avoid your known triggers including alcohol.  Take allopurinol as directed but did not start taking it until at least 2 weeks after resolution of this acute gout attack.  For of any worsening or concerning symptoms you can return for evaluation.

## 2022-05-02 ENCOUNTER — Ambulatory Visit: Payer: Self-pay | Admitting: Internal Medicine

## 2022-05-02 ENCOUNTER — Encounter: Payer: Self-pay | Admitting: Internal Medicine

## 2022-05-02 VITALS — BP 128/72 | HR 75 | Temp 98.0°F | Resp 18 | Ht 66.0 in | Wt 257.4 lb

## 2022-05-02 DIAGNOSIS — M109 Gout, unspecified: Secondary | ICD-10-CM | POA: Insufficient documentation

## 2022-05-02 NOTE — Assessment & Plan Note (Signed)
The patient just recovered from an acute gout flare about 6 days ago.  He is currently on allopurinol, uloric and colchicine.  I am going to obtain a uric acid level on him today.

## 2022-05-02 NOTE — Progress Notes (Unsigned)
Office Visit  Subjective   Patient ID: Kevin Shaw   DOB: April 13, 1996   Age: 27 y.o.   MRN: [id number]   Chief Complaint Chief Complaint  Patient presents with   Gout    Allopurinol not working     History of Present Illness The patient returns today for an acute visit for gout involving his right knee.  This started about a week ago with swelling and pain.  I saw him 3 months ago and we noted his uric acid level was elevated despite his dose of allopurinol.  I started him on uloric 40mg  daily and he states he remains compliant on this but still ended up with gout.  He called the office last week and someone wrote him for allopurinol but this has not helped.   He is also on colchicine 0.6mg  daily.  He was initially diagnosed with gout in 2000.  He had a gout flare and has had multiple flares over time with his last gout flare maybe 5 months ago.  He has noticed that beer causes his gout to flare.  He drank one beer this past Saturday about 6 days ago and began having pain and swelling of his right knee.  His gout flares usually involve his right ankle or knee.         Past Medical History No past medical history on file.   Allergies No Known Allergies   Medications  Current Outpatient Medications:    clonazePAM (KLONOPIN) 0.5 MG tablet, Take 0.5 mg by mouth 2 (two) times daily., Disp: , Rfl:    DULoxetine (CYMBALTA) 30 MG capsule, Take 30 mg by mouth daily., Disp: , Rfl:    febuxostat (ULORIC) 40 MG tablet, Take 40 mg by mouth daily., Disp: , Rfl:    albuterol (VENTOLIN HFA) 108 (90 Base) MCG/ACT inhaler, Inhale 2 puffs into the lungs every 6 (six) hours as needed for wheezing or shortness of breath., Disp: , Rfl:    allopurinol (ZYLOPRIM) 100 MG tablet, Take 1 tablet by mouth daily., Disp: , Rfl:    cephALEXin (KEFLEX) 500 MG capsule, Take 1 capsule (500 mg total) by mouth 4 (four) times daily., Disp: 28 capsule, Rfl: 0   colchicine 0.6 MG tablet, Take 0.6 mg by mouth daily.,  Disp: , Rfl:    lidocaine (XYLOCAINE) 2 % solution, Use as directed 15 mLs in the mouth or throat every 6 (six) hours as needed (throat/stomach pain). Take mixed maalox 58mL, Disp: 100 mL, Rfl: 0   oxyCODONE (ROXICODONE) 5 MG immediate release tablet, Take 1 tablet (5 mg total) by mouth every 4 (four) hours as needed for severe pain., Disp: 10 tablet, Rfl: 0   pantoprazole (PROTONIX) 20 MG tablet, Take 20 mg by mouth daily., Disp: , Rfl:    predniSONE (STERAPRED UNI-PAK 21 TAB) 10 MG (21) TBPK tablet, Take by mouth daily. Take 6 tabs by mouth daily  for 2 days, then 5 tabs for 2 days, then 4 tabs for 2 days, then 3 tabs for 2 days, 2 tabs for 2 days, then 1 tab by mouth daily for 2 days, Disp: 42 tablet, Rfl: 0   Review of Systems Review of Systems  Constitutional:  Negative for chills and fever.  Eyes:  Negative for blurred vision and double vision.  Respiratory:  Negative for cough and shortness of breath.   Cardiovascular:  Negative for chest pain, palpitations and leg swelling.  Gastrointestinal:  Negative for constipation, diarrhea, nausea and vomiting.  Musculoskeletal:  Negative for myalgias.       Objective:    Vitals BP 128/72 (BP Location: Left Arm, Patient Position: Sitting, Cuff Size: Normal)   Pulse 75   Temp 98 F (36.7 C) (Temporal)   Resp 18   Ht 5\' 6"  (1.676 m)   Wt 257 lb 6.4 oz (116.8 kg)   SpO2 98%   BMI 41.55 kg/m    Physical Examination Physical Exam Constitutional:      Appearance: Normal appearance. He is not ill-appearing.  Cardiovascular:     Rate and Rhythm: Normal rate and regular rhythm.     Pulses: Normal pulses.     Heart sounds: No murmur heard.    No friction rub. No gallop.  Pulmonary:     Effort: Pulmonary effort is normal. No respiratory distress.     Breath sounds: No wheezing, rhonchi or rales.  Abdominal:     General: Abdomen is flat. Bowel sounds are normal. There is no distension.     Palpations: Abdomen is soft.     Tenderness:  There is no abdominal tenderness.  Musculoskeletal:        General: No swelling.     Right lower leg: No edema.     Left lower leg: No edema.  Neurological:     Mental Status: He is alert.        Assessment & Plan:   Acute gout of right knee The patient just recovered from an acute gout flare about 6 days ago.  He is currently on allopurinol, uloric and colchicine.  I am going to obtain a uric acid level on him today.    No follow-ups on file.   Townsend Roger, MD

## 2023-05-01 IMAGING — CT CT ABD-PELV W/ CM
2 of 4 series · 17 of 46 positions shown, 19 images · IV contrast (omnipaque)
Comparison: None.

CLINICAL DATA: Shortness of breath and abdominal pain.

EXAM:
CT ABDOMEN AND PELVIS WITH CONTRAST
TECHNIQUE: Multidetector CT imaging of the abdomen and pelvis was performed
using the standard protocol following bolus administration of
intravenous contrast.
CONTRAST:  80mL OMNIPAQUE IOHEXOL 350 MG/ML SOLN

[Series 2: axial st · axial · 0.90mm/px · z∈[-650,-194]mm · 14 of 105 slices shown, 16 images]
[im 7/105  soft-tissue]
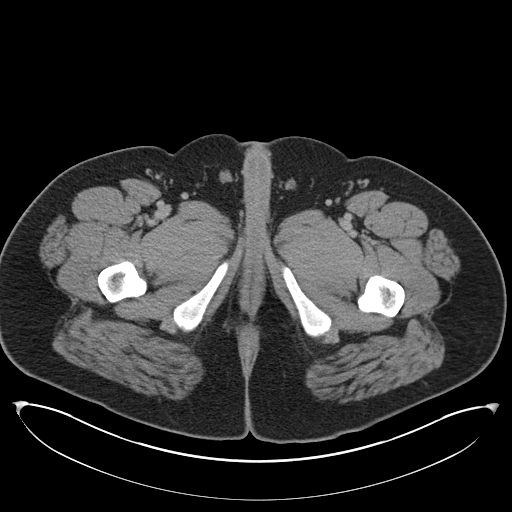
[im 7/105  bone]
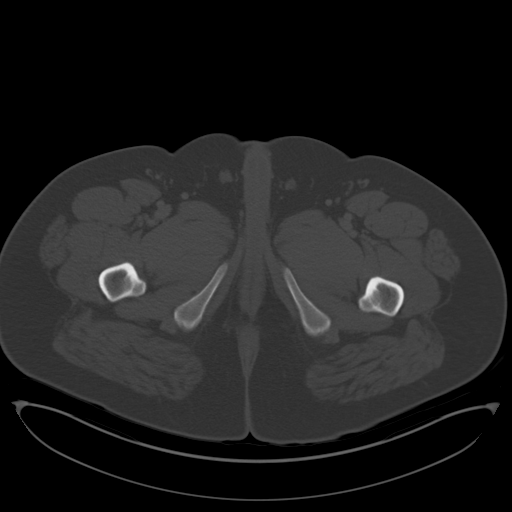
[im 14/105  soft-tissue]
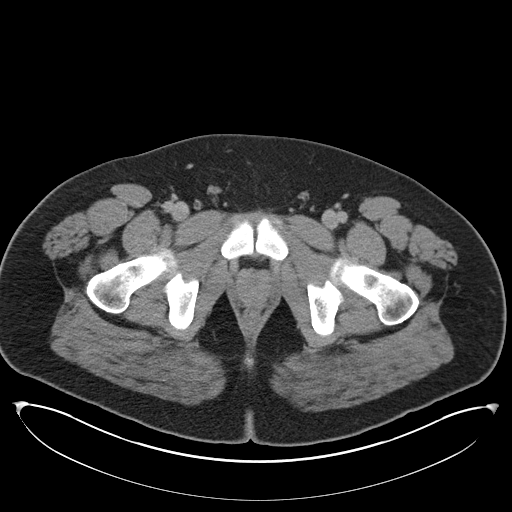
[im 20/105  soft-tissue]
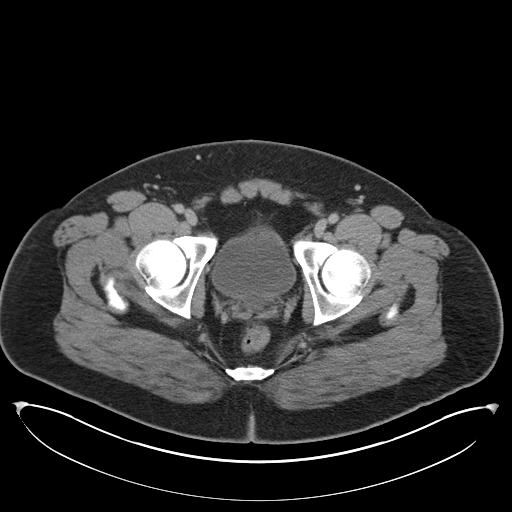
[im 27/105  soft-tissue]
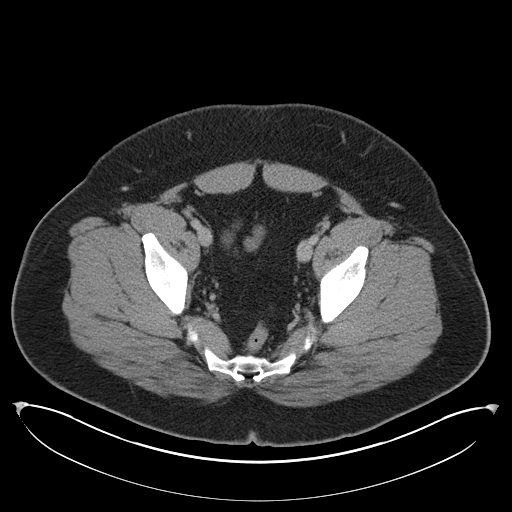
[im 33/105  soft-tissue]
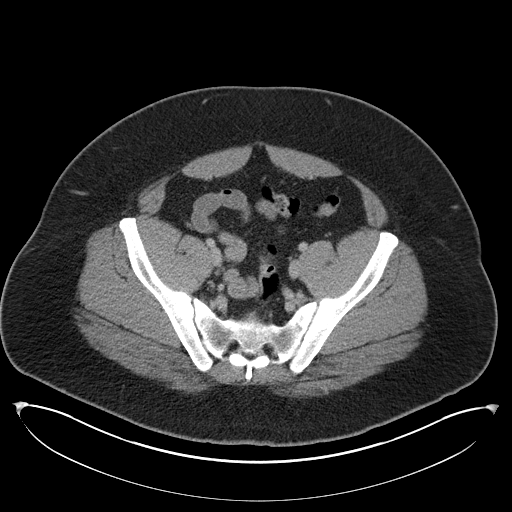
[im 40/105  soft-tissue]
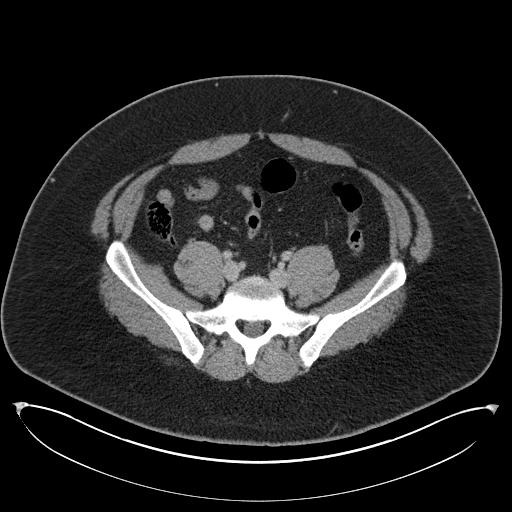
[im 46/105  soft-tissue]
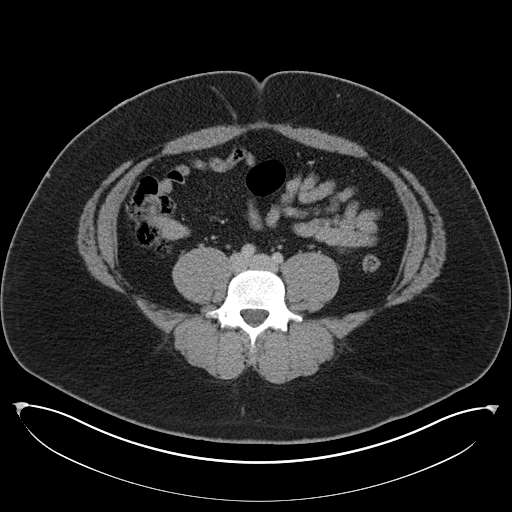
[im 59/105  soft-tissue]
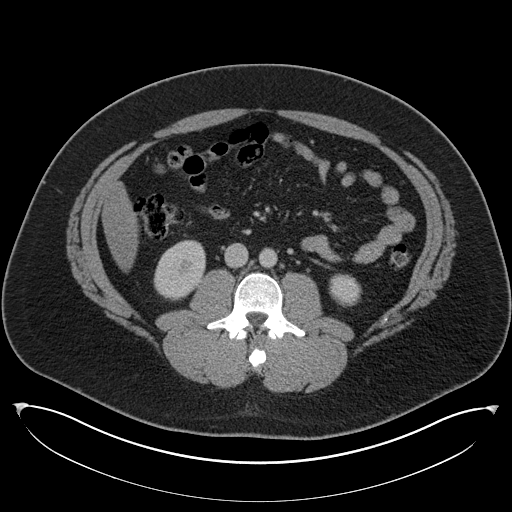
[im 66/105  soft-tissue]
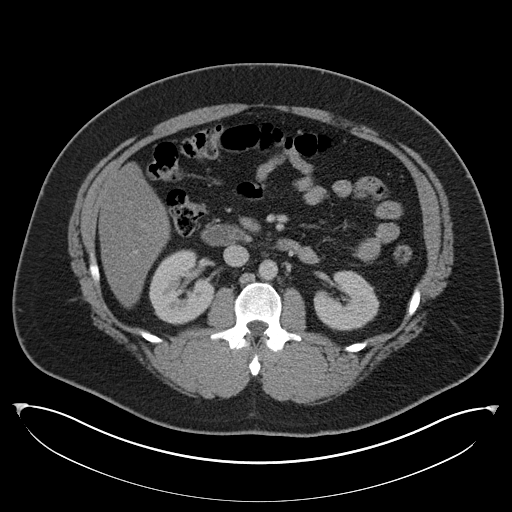
[im 66/105  bone]
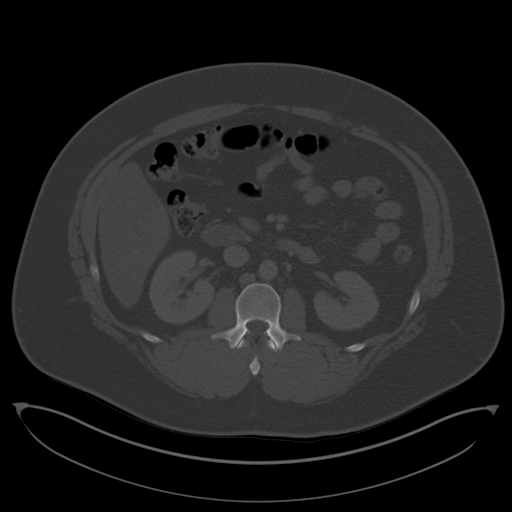
[im 72/105  soft-tissue]
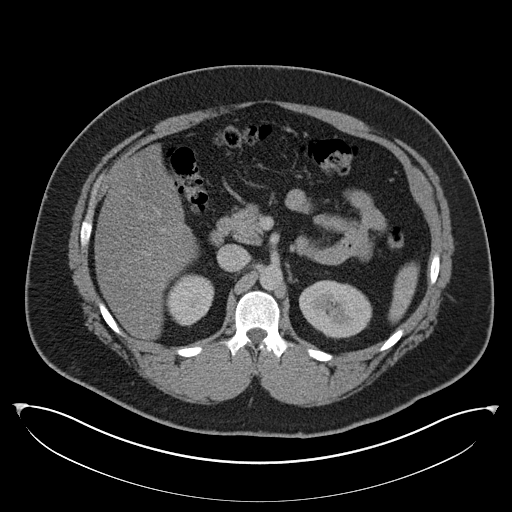
[im 79/105  soft-tissue]
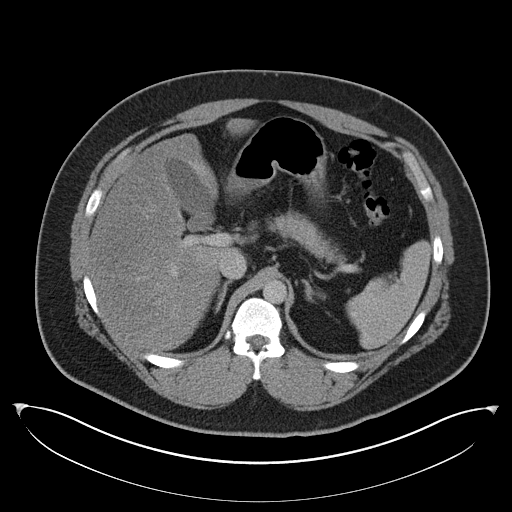
[im 85/105  soft-tissue]
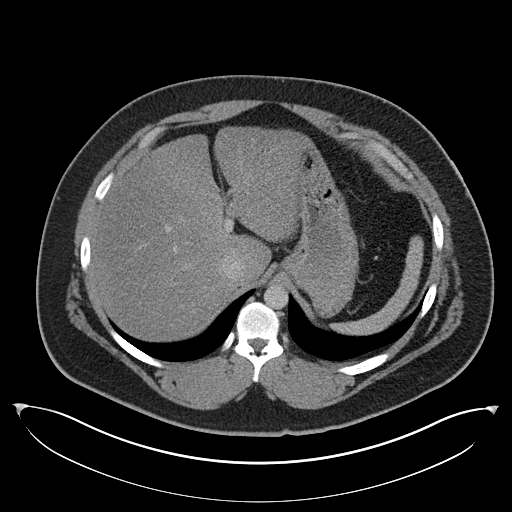
[im 92/105  soft-tissue]
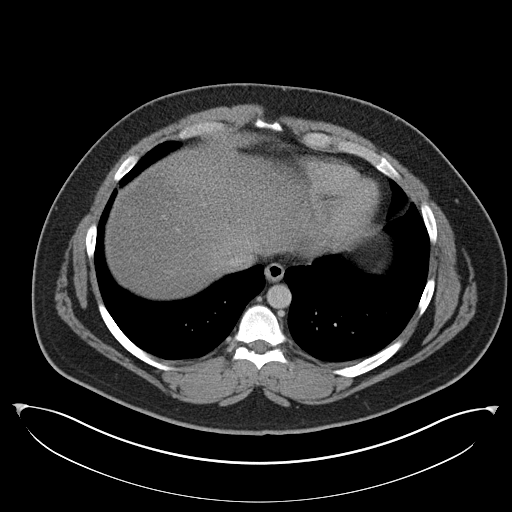
[im 98/105  soft-tissue]
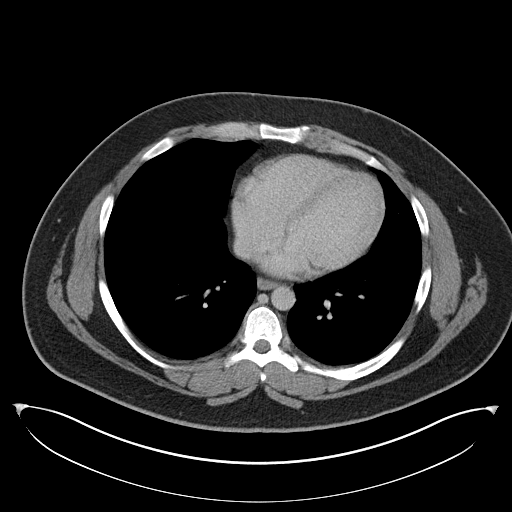

[Series 5: coronal st · coronal · 0.92mm/px · 3 of 167 slices shown]
[im 56/167  soft-tissue]
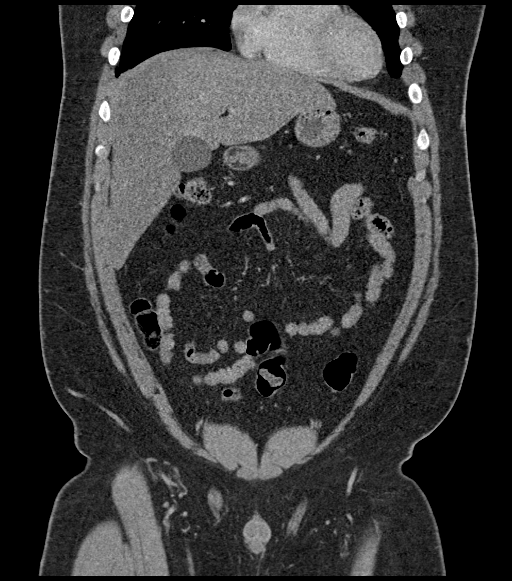
[im 74/167  soft-tissue]
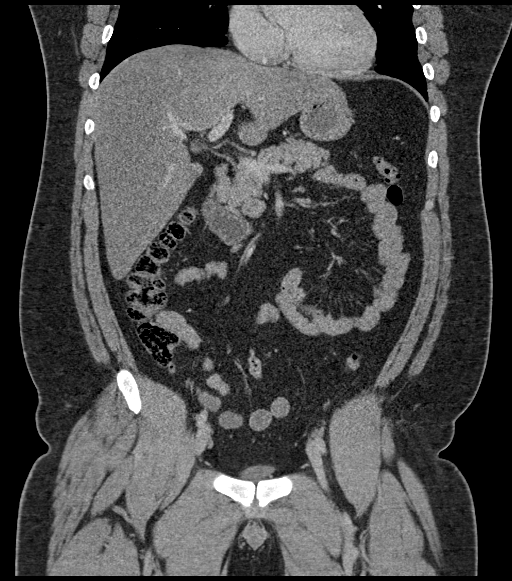
[im 93/167  soft-tissue]
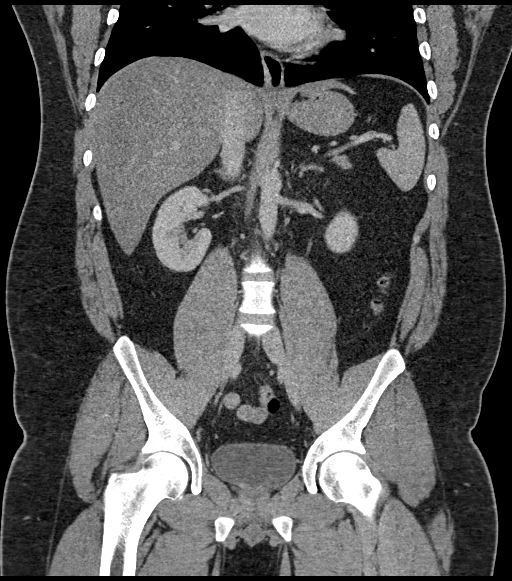

[17 of 46 positions shown; findings below may reference images not displayed]

FINDINGS: Lower chest: No acute abnormality.

Hepatobiliary: There is diffuse fatty infiltration of the liver
parenchyma. No focal liver abnormality is seen. No gallstones,
gallbladder wall thickening, or biliary dilatation.

Pancreas: Unremarkable. No pancreatic ductal dilatation or
surrounding inflammatory changes.

Spleen: Normal in size without focal abnormality.

Adrenals/Urinary Tract: Adrenal glands are unremarkable. Kidneys are
normal, without renal calculi, focal lesion, or hydronephrosis.
Bladder is unremarkable.

Stomach/Bowel: Stomach is within normal limits. Appendix appears
normal. No evidence of bowel wall thickening, distention, or
inflammatory changes.

Vascular/Lymphatic: No significant vascular findings are present. No
enlarged abdominal or pelvic lymph nodes.

Reproductive: Prostate is unremarkable.

Other: No abdominal wall hernia or abnormality. No abdominopelvic
ascites.

Musculoskeletal: No acute or significant osseous findings.
IMPRESSION: 1. Fatty liver.
2. No acute or active process within the abdomen or pelvis.

## 2023-10-19 IMAGING — CT CT ABD-PELV W/ CM
2 of 5 series · 17 of 46 positions shown, 19 images · IV contrast (agent unspecified)
Comparison: CT with contrast 11/10/2020 .

CLINICAL DATA: Left lower quadrant pain, worse after eating.

EXAM:
CT ABDOMEN AND PELVIS WITH CONTRAST
TECHNIQUE: Multidetector CT imaging of the abdomen and pelvis was performed
using the standard protocol following bolus administration of
intravenous contrast.

[Series 3: a/p w/ 5mm · axial · 0.98mm/px · z∈[+718,+1208]mm · 14 of 110 slices shown, 16 images]
[im 6/110  soft-tissue]
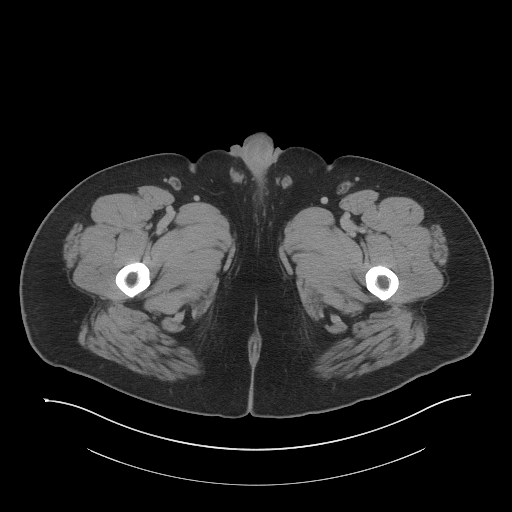
[im 6/110  bone]
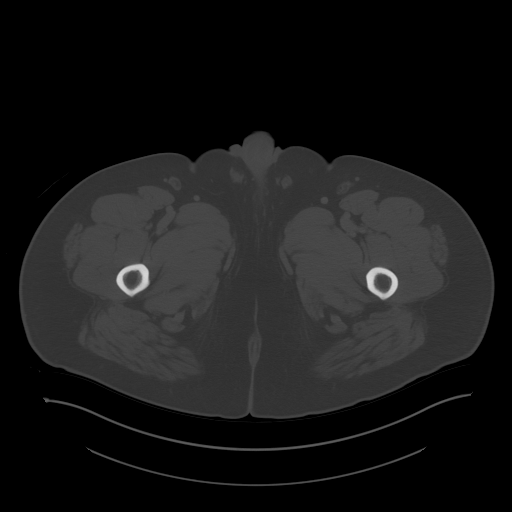
[im 17/110  soft-tissue]
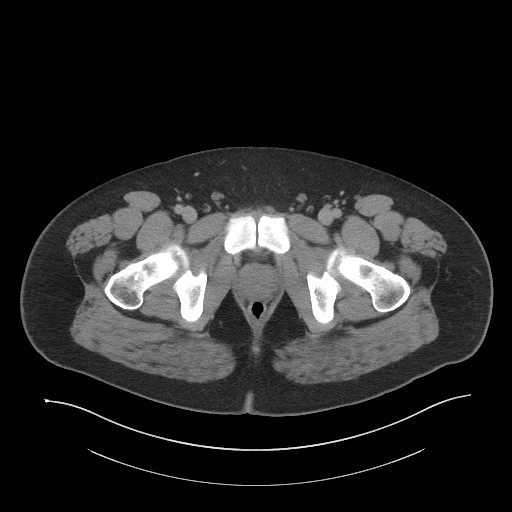
[im 22/110  soft-tissue]
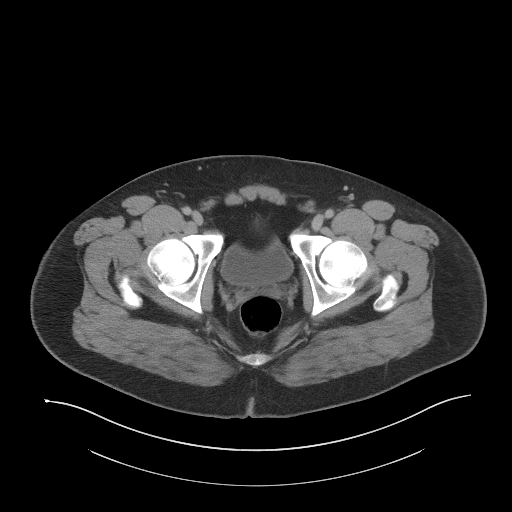
[im 28/110  soft-tissue]
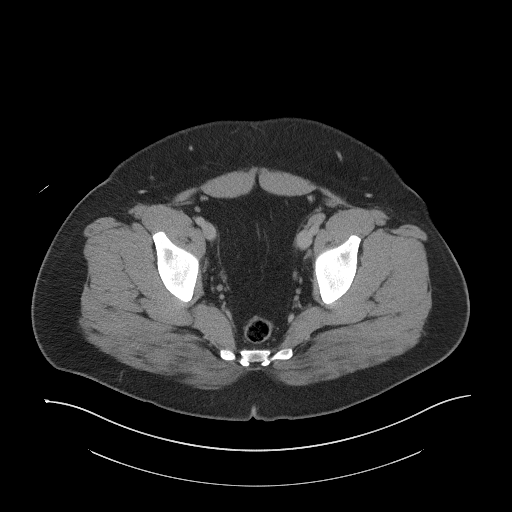
[im 39/110  soft-tissue]
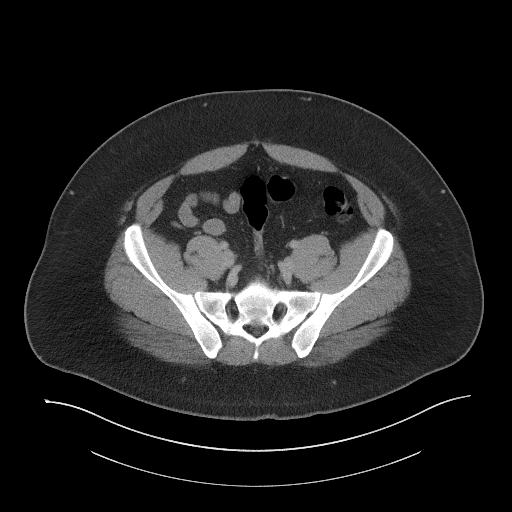
[im 44/110  soft-tissue]
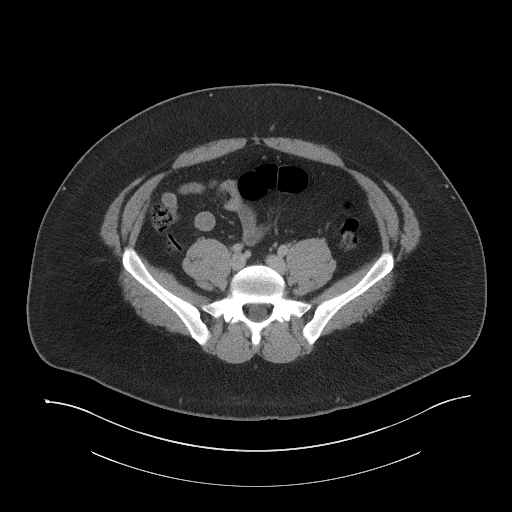
[im 50/110  soft-tissue]
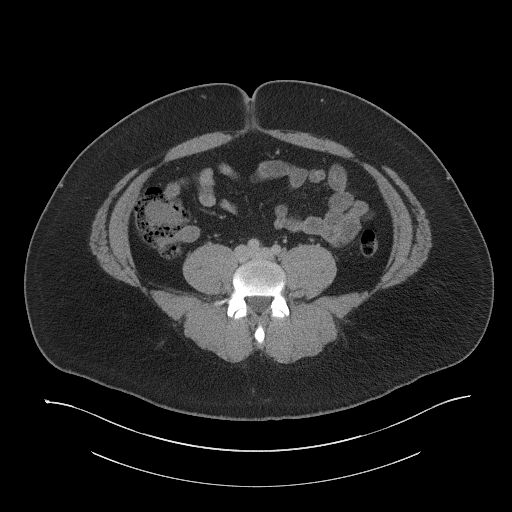
[im 60/110  soft-tissue]
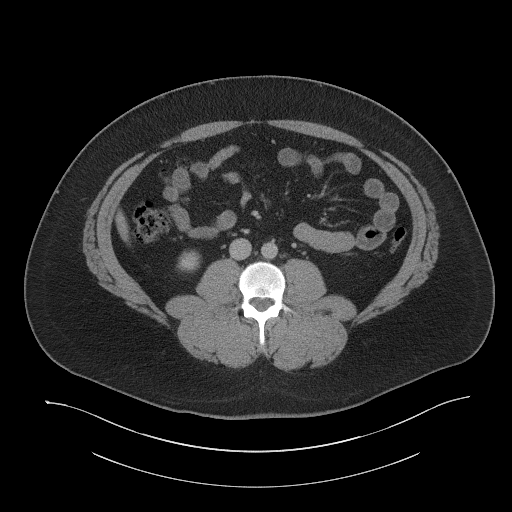
[im 66/110  soft-tissue]
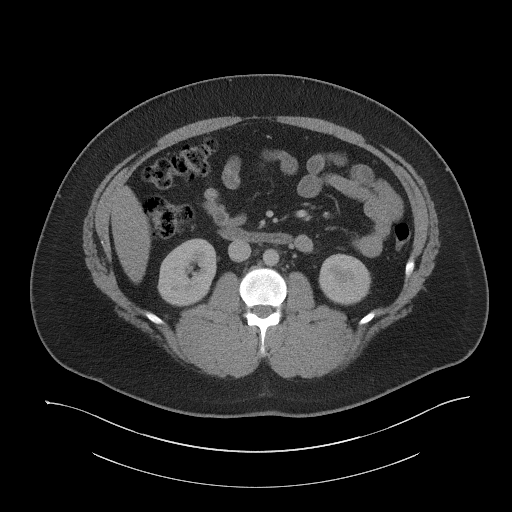
[im 66/110  bone]
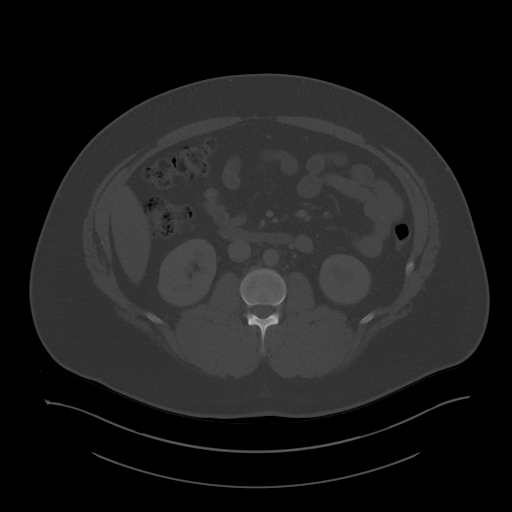
[im 71/110  soft-tissue]
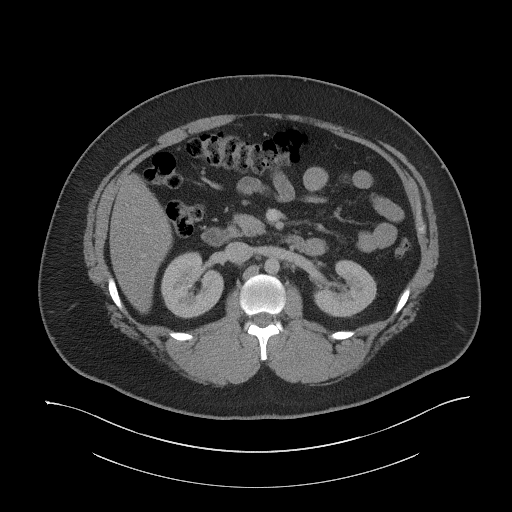
[im 82/110  soft-tissue]
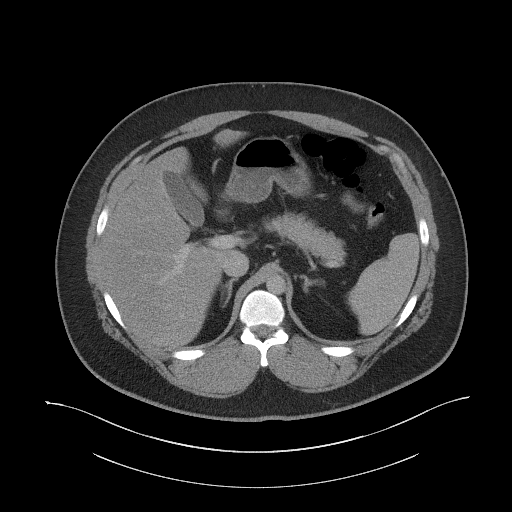
[im 88/110  soft-tissue]
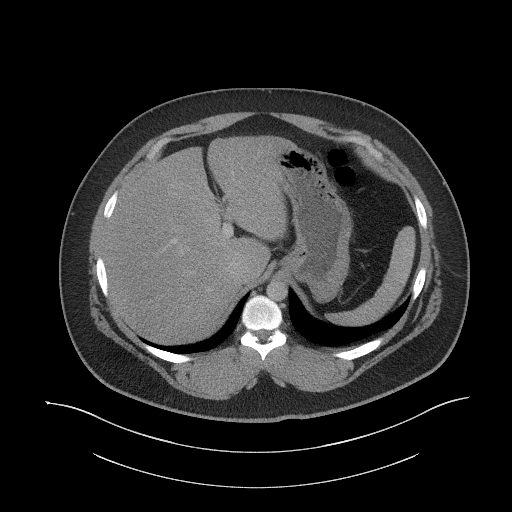
[im 93/110  soft-tissue]
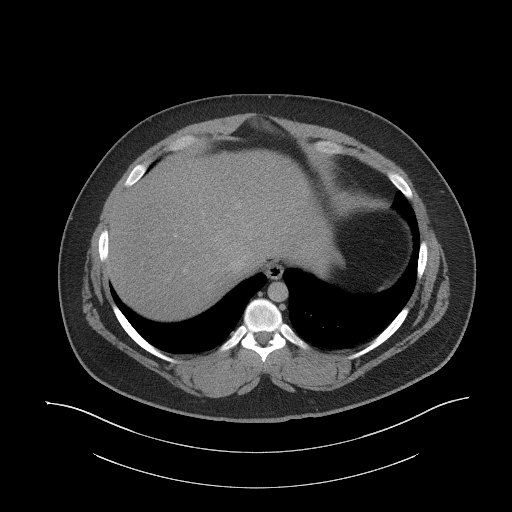
[im 104/110  soft-tissue]
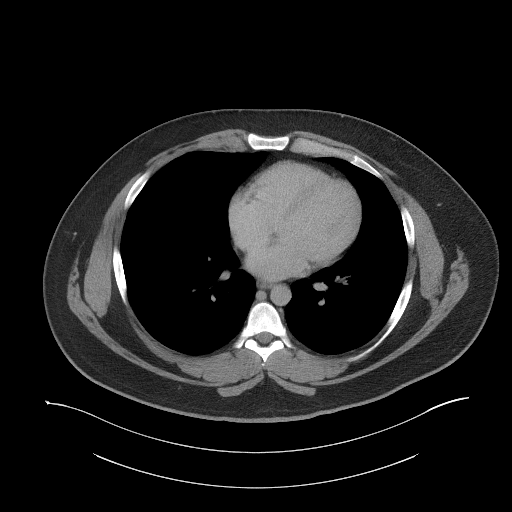

[Series 6: a/p w/ cor · coronal · 1.05mm/px · 3 of 194 slices shown]
[im 65/194  soft-tissue]
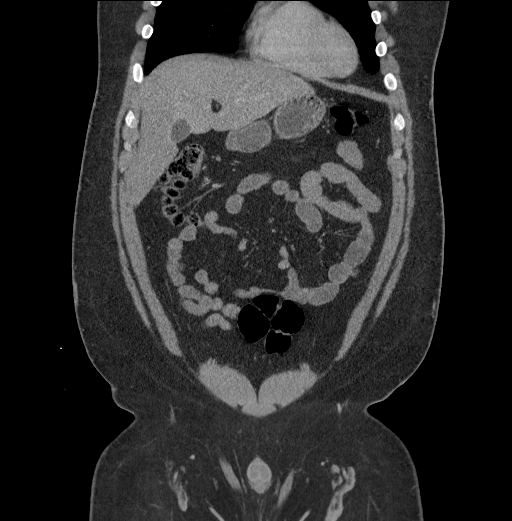
[im 86/194  soft-tissue]
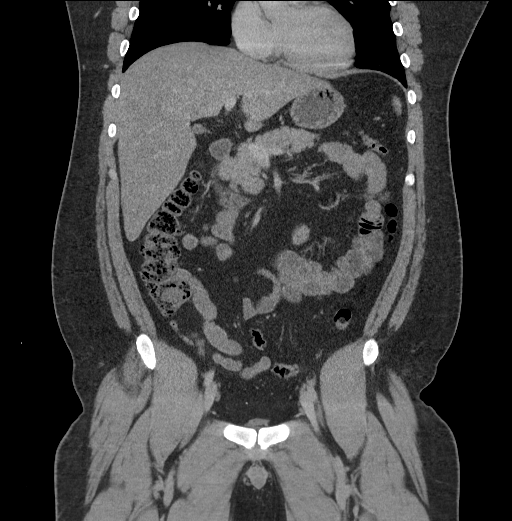
[im 108/194  soft-tissue]
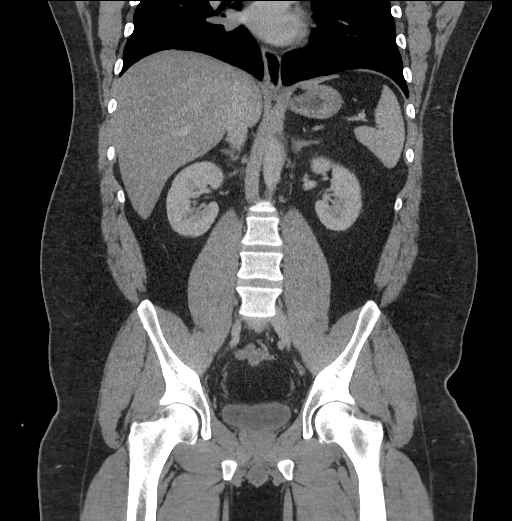

[17 of 46 positions shown; findings below may reference images not displayed]

RADIATION DOSE REDUCTION: This exam was performed according to the
departmental dose-optimization program which includes automated
exposure control, adjustment of the mA and/or kV according to
patient size and/or use of iterative reconstruction technique.

CONTRAST:  100mL OMNIPAQUE IOHEXOL 300 MG/ML  SOLN
FINDINGS: Lower chest: No acute abnormality.

Hepatobiliary: 22.5 cm length mildly steatotic liver without mass.
Gallbladder and bile ducts are unremarkable.

Pancreas: Unremarkable. No pancreatic ductal dilatation or
surrounding inflammatory changes.

Spleen: No mass enhancement or splenomegaly.

Adrenals/Urinary Tract: There is no adrenal mass , no renal cortical
mass , no calculus or hydronephrosis is seen and no bladder
thickening.

Stomach/Bowel: No dilatation or wall thickening including the
appendix. No acute mesenteric inflammatory changes. Sigmoid
diverticulosis without evidence of diverticulitis.

Vascular/Lymphatic: No significant vascular findings are present. No
enlarged abdominal or pelvic lymph nodes.

Reproductive: Normal prostate.

Other: Small umbilical fat hernia.  No free air, hemorrhage or fluid

Musculoskeletal: No acute or significant osseous findings.
IMPRESSION: 1. No findings to explain the patient's left lower quadrant pain.
2. There are sigmoid diverticula but no inflammatory changes or wall
thickening.
3. Mild hepatic steatosis and enlargement.
